# Patient Record
Sex: Female | Born: 1935 | Race: White | Hispanic: No | Smoking: Current every day smoker
Health system: Southern US, Community
[De-identification: ages and names within clinical notes are randomized; demographics above are authoritative.]

## PROBLEM LIST (undated history)

## (undated) DIAGNOSIS — C801 Malignant (primary) neoplasm, unspecified: Secondary | ICD-10-CM

## (undated) DIAGNOSIS — K589 Irritable bowel syndrome without diarrhea: Secondary | ICD-10-CM

## (undated) DIAGNOSIS — N200 Calculus of kidney: Secondary | ICD-10-CM

## (undated) DIAGNOSIS — K219 Gastro-esophageal reflux disease without esophagitis: Secondary | ICD-10-CM

## (undated) DIAGNOSIS — I10 Essential (primary) hypertension: Secondary | ICD-10-CM

## (undated) DIAGNOSIS — E785 Hyperlipidemia, unspecified: Secondary | ICD-10-CM

## (undated) HISTORY — DX: Irritable bowel syndrome, unspecified: K58.9

## (undated) HISTORY — PX: BREAST SURGERY: SHX581

## (undated) HISTORY — DX: Gastro-esophageal reflux disease without esophagitis: K21.9

## (undated) HISTORY — PX: OTHER SURGICAL HISTORY: SHX169

## (undated) HISTORY — DX: Essential (primary) hypertension: I10

## (undated) HISTORY — DX: Malignant (primary) neoplasm, unspecified: C80.1

## (undated) HISTORY — DX: Hyperlipidemia, unspecified: E78.5

## (undated) HISTORY — DX: Calculus of kidney: N20.0

---

## 2005-10-27 ENCOUNTER — Ambulatory Visit: Payer: Self-pay | Admitting: Internal Medicine

## 2005-10-29 ENCOUNTER — Ambulatory Visit: Payer: Self-pay | Admitting: Gastroenterology

## 2005-11-09 ENCOUNTER — Ambulatory Visit: Payer: Self-pay | Admitting: Internal Medicine

## 2006-12-22 ENCOUNTER — Ambulatory Visit: Payer: Self-pay | Admitting: Internal Medicine

## 2007-05-11 ENCOUNTER — Ambulatory Visit: Payer: Self-pay | Admitting: Internal Medicine

## 2008-02-14 ENCOUNTER — Ambulatory Visit: Payer: Self-pay | Admitting: Internal Medicine

## 2009-11-12 ENCOUNTER — Ambulatory Visit: Payer: Self-pay | Admitting: Internal Medicine

## 2011-03-11 ENCOUNTER — Ambulatory Visit: Payer: Self-pay | Admitting: Internal Medicine

## 2011-04-09 ENCOUNTER — Ambulatory Visit: Payer: Self-pay | Admitting: Emergency Medicine

## 2011-04-09 LAB — HEMOGLOBIN: HGB: 12.7 g/dL (ref 12.0–16.0)

## 2011-04-16 ENCOUNTER — Ambulatory Visit: Payer: Self-pay | Admitting: Emergency Medicine

## 2013-11-28 ENCOUNTER — Ambulatory Visit: Payer: Self-pay | Admitting: Obstetrics and Gynecology

## 2014-01-25 ENCOUNTER — Ambulatory Visit (INDEPENDENT_AMBULATORY_CARE_PROVIDER_SITE_OTHER): Payer: Medicare Other | Admitting: General Surgery

## 2014-01-25 ENCOUNTER — Encounter: Payer: Self-pay | Admitting: General Surgery

## 2014-01-25 VITALS — BP 136/80 | HR 66 | Resp 14 | Ht <= 58 in | Wt 119.0 lb

## 2014-01-25 DIAGNOSIS — L723 Sebaceous cyst: Secondary | ICD-10-CM

## 2014-01-25 NOTE — Patient Instructions (Addendum)
The patient is aware to call back for any questions or concerns.  

## 2014-01-25 NOTE — Progress Notes (Signed)
Patient ID: Nancy Davies, female   DOB: 16-Apr-1934, 79 y.o.   MRN: [id number]  Chief Complaint  Patient presents with  . Abscess    HPI Nancy Davies is a 79 y.o. female.  here today for evaluation of an abscess on right side of back. The patient states the area has been there for years but within the last week it has gotten larger and painful. There is some drainage noted. She was started on Doxycycline which she took for a couple of days and stopped it because she had diarrhea and dizziness. She denies any fever or chills.   HPI  Past Medical History  Diagnosis Date  . Hypertension   . Hyperlipidemia   . Kidney stones   . 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 GERD (gastroesophageal reflux disease)   . IBS (irritable bowel syndrome)     Past Surgical History  Procedure Laterality Date  . Cesarean section  1975  . Breast surgery  2005,2011  . Excision of sebacceous cyst      x 2    History reviewed. No pertinent family history.  Social History History  Substance Use Topics  . Smoking status: Current Every Day Smoker -- 1.00 packs/day for 50 years  . Smokeless tobacco: Not on file  . Alcohol Use: Yes    Allergies  Allergen Reactions  . Sulfa Antibiotics Other (See Comments)    Dry heaves    Current Outpatient Prescriptions  Medication Sig Dispense Refill  . ADVAIR DISKUS 250-50 MCG/DOSE AEPB       . B Complex Vitamins (VITAMIN B COMPLEX PO) Take by mouth.      . BYSTOLIC 10 MG tablet       . montelukast (SINGULAIR) 10 MG tablet       . nitrofurantoin, macrocrystal-monohydrate, (MACROBID) 100 MG capsule       . pantoprazole (PROTONIX) 40 MG tablet       . simvastatin (ZOCOR) 10 MG tablet       . SPIRIVA HANDIHALER 18 MCG inhalation capsule Place 18 mcg into inhaler and inhale daily.       . TEKTURNA 300 MG tablet       . VITAMIN D, ERGOCALCIFEROL, PO Take by mouth.       No current facility-administered medications for this visit.    Review of  Systems Review of Systems  Constitutional: Negative.   Respiratory: Negative.   Cardiovascular: Negative.     Blood pressure 136/80, pulse 66, resp. rate 14, height 4\' 10"  (1.473 m), weight 119 lb (53.978 kg).  Physical Exam Physical Exam  Constitutional: She is oriented to person, place, and time. She appears well-developed and well-nourished.  Neurological: She is alert and oriented to person, place, and time.  Skin: Skin is warm and dry.  2 cm sebaceous cyst infrascapular region with developing sebaceous (horn) concretions. No redness or induration surrounding skin.     Data Reviewed    Assessment    Sebseous cyst     Plan    Discussed excision. Pt wishes to wait till next yr. Aware to call if she has any more drainage or increased pain with the cyst        Newt Levingston G 01/25/2014, 6:36 PM

## 2014-01-30 ENCOUNTER — Encounter: Payer: Self-pay | Admitting: General Surgery

## 2014-04-24 ENCOUNTER — Ambulatory Visit: Payer: Self-pay | Admitting: Family Medicine

## 2014-07-22 NOTE — Op Note (Signed)
PATIENT NAME:  Nancy Davies, Nancy Davies MR#:  [id number] DATE OF BIRTH:  [birth year]  DATE OF PROCEDURE:  04/16/2011  PREOPERATIVE DIAGNOSIS: Right breast tumor with microcalcification.   POSTOPERATIVE DIAGNOSIS: Right breast tumor with microcalcification.   OPERATION: Right breast needle localization biopsy.   SURGEON: Lynnann Knudsen S. Jakie Debow, MD   INDICATION FOR SURGERY: This patient was seen by me with Davies mammogram showing microcalcification in the right breast. The patient had no pain. The patient had similar surgery done in the past and now she is having mammogram every year. She has Davies strong family history of breast cancer. I did not feel any mass in the breast but the microcalcification looked very suspicious. The patient was then brought to surgery.   PROCEDURE: Under MAC anesthesia, the right breast was then prepped and draped. It was then infiltrated with 1% Xylocaine and Marcaine mixture. After making incision, cutting skin and subcutaneous tissue, the breast tissue was then incised. The breast wire was quite deep. I went away from the wire because Davies microcalcification was in the middle of the thick part of the needle. I went all the way down to the wire and removed the end of the wire. After the bleeding was stopped with the Bovie, the wound was then closed in layers with 3-0 Vicryl and 4-0 Vicryl. Subcuticular suturing was then done. I got the x-ray report and was told microcalcifications were present in the specimen. The specimen was then sent to the laboratory for examination.   The patient tolerated the procedure well and was sent to the recovery room in satisfactory condition.    ____________________________ Welford Roche Phylis Bougie, MD [id number] D: 04/16/2011 11:27:04 ET T: 04/16/2011 12:20:06 ET JOB#: [id number]  cc: Sean Malinowski S. Phylis Bougie, MD, <Dictator>, Perrin Maltese, MD Sharene Butters MD ELECTRONICALLY SIGNED 04/21/2011 13:02

## 2015-03-30 IMAGING — CT CT ABDOMEN AND PELVIS WITHOUT AND WITH CONTRAST
2 of 10 series · 10 of 46 positions shown, 16 images · IV contrast (isovue)
Comparison: None.

CLINICAL DATA: Urinary tract infections

EXAM:
CT ABDOMEN AND PELVIS WITHOUT AND WITH CONTRAST
TECHNIQUE: Multidetector CT imaging of the abdomen and pelvis was performed
following the standard protocol before and following the bolus
administration of intravenous contrast.
CONTRAST:  100 cc of Isovue 370.

[Series 2: hematuria > 45 wo · axial · 0.72mm/px · z∈[-520,-205]mm · 8 of 81 slices shown, 13 images]
[im 9/81  soft-tissue]
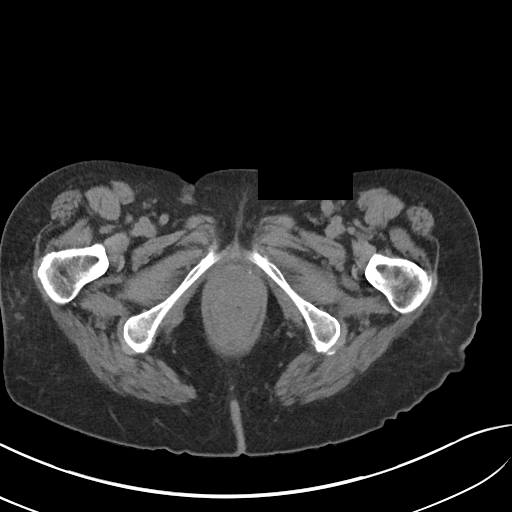
[im 9/81  bone]
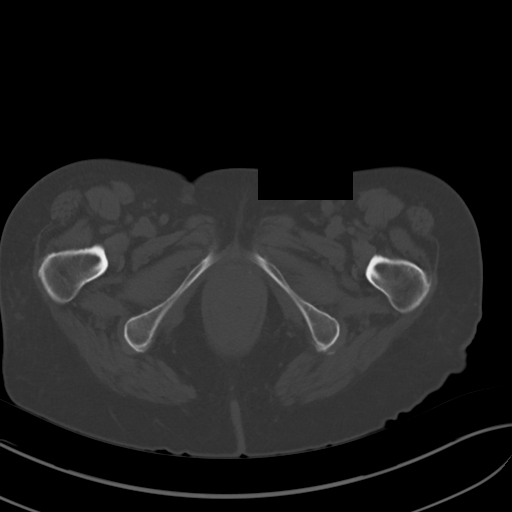
[im 18/81  soft-tissue]
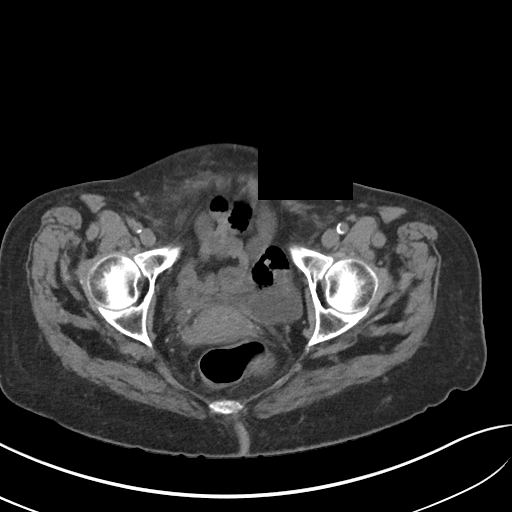
[im 27/81  soft-tissue]
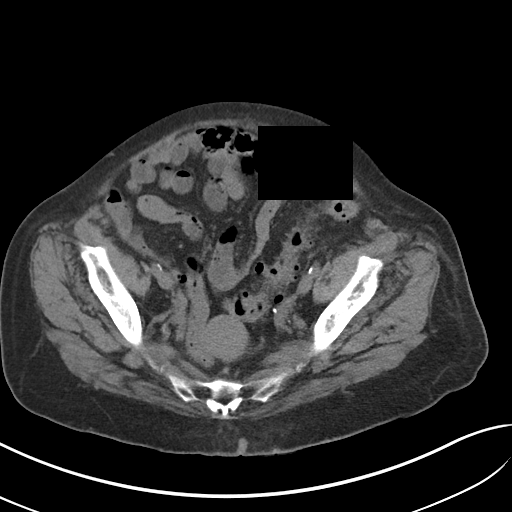
[im 36/81  soft-tissue]
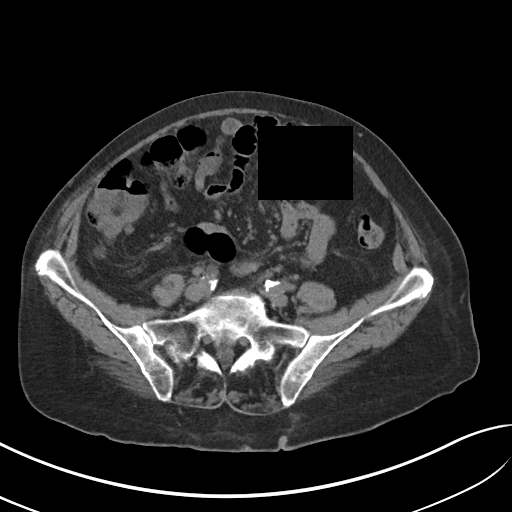
[im 45/81  soft-tissue]
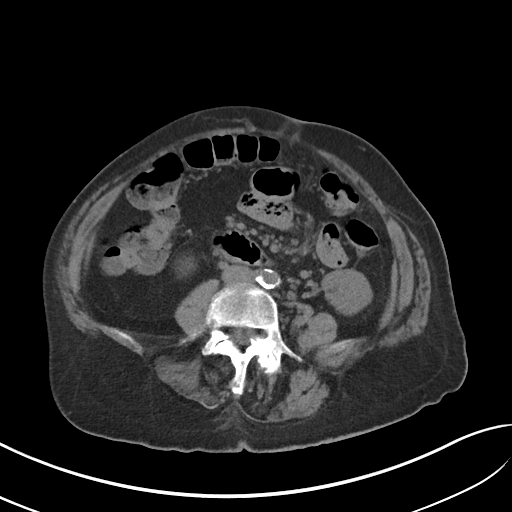
[im 45/81  lung]
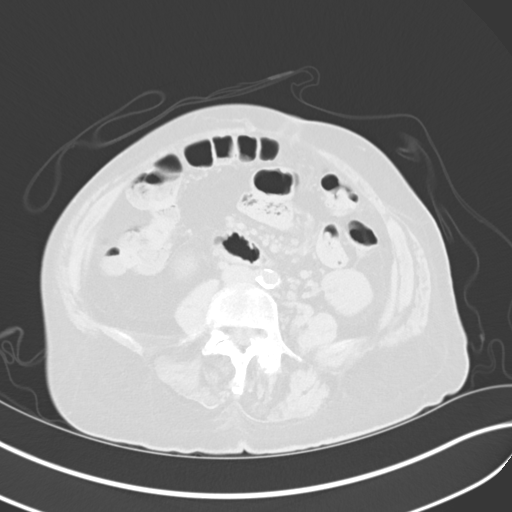
[im 54/81  soft-tissue]
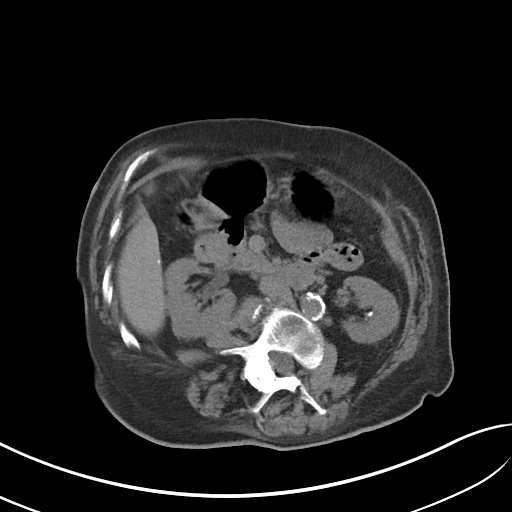
[im 54/81  lung]
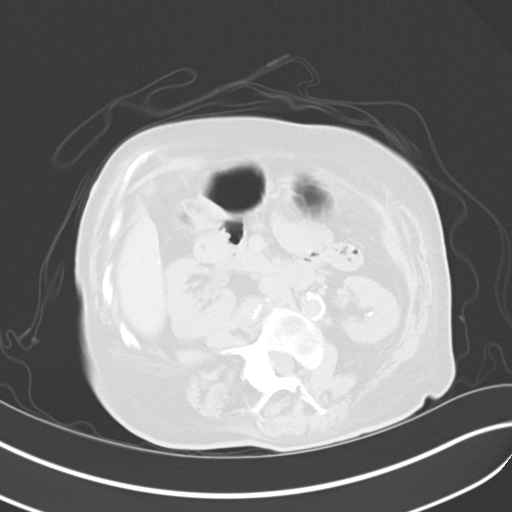
[im 63/81  soft-tissue]
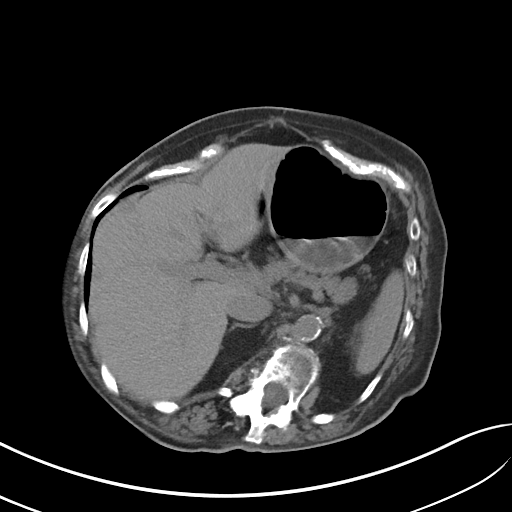
[im 63/81  lung]
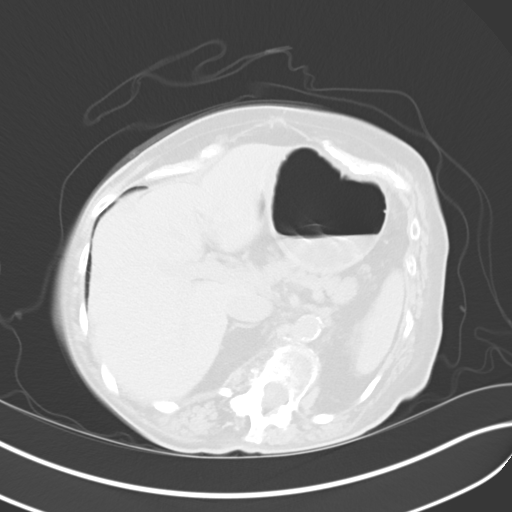
[im 72/81  soft-tissue]
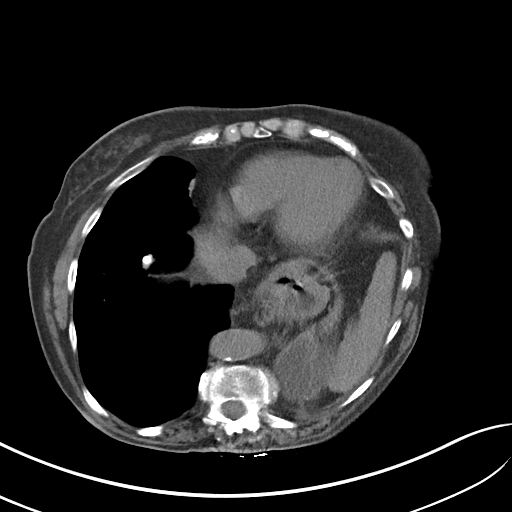
[im 72/81  lung]
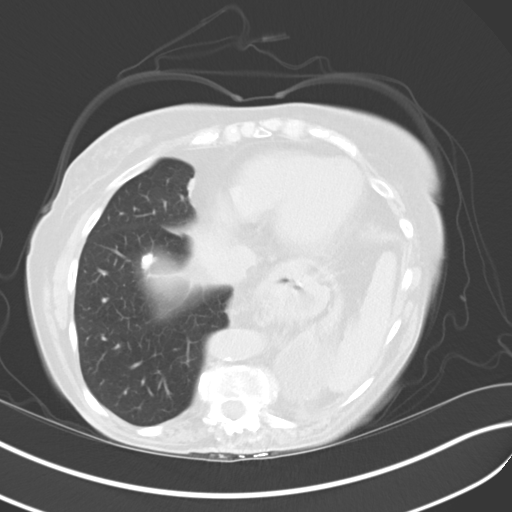

[Series 7: cor hematuria > 45 wo · coronal · 0.69mm/px · 2 of 131 slices shown, 3 images]
[im 44/131  soft-tissue]
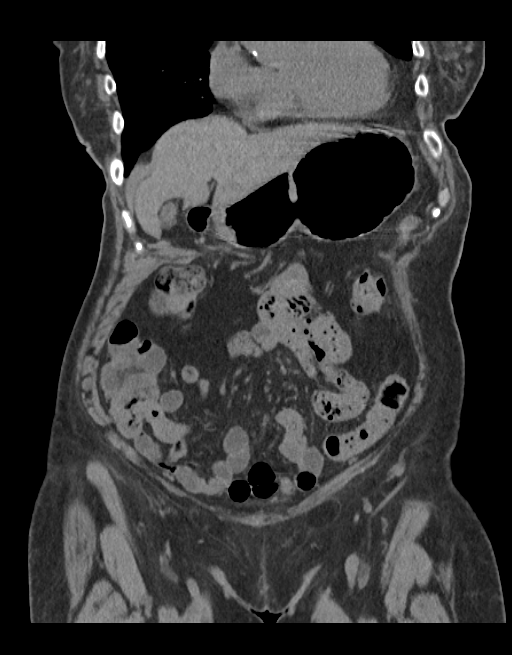
[im 44/131  bone]
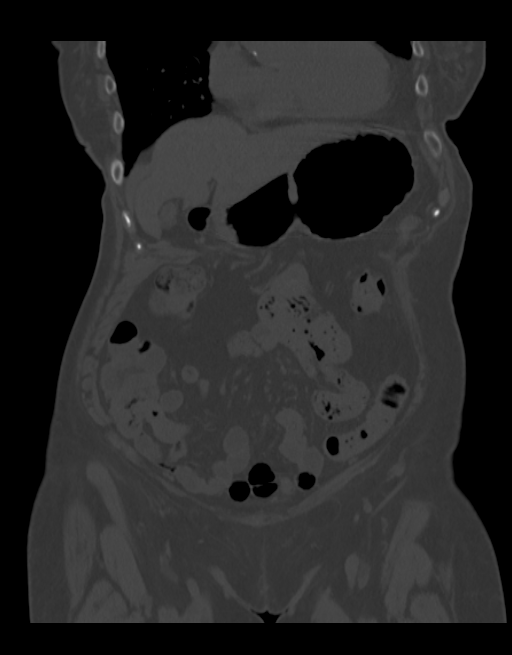
[im 87/131  soft-tissue]
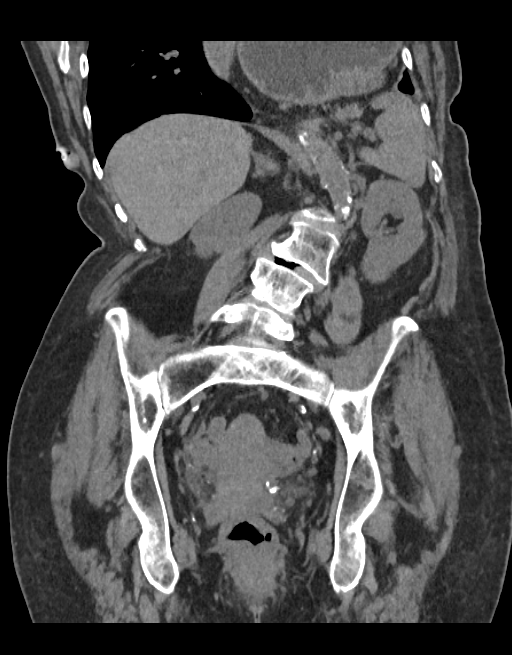

[10 of 46 positions shown; findings below may reference images not displayed]

FINDINGS: There is no pleural effusion identified. Within the posterior left
hemithorax there is a peripherally calcified cavitary mass measuring
5.9 x 2.5 x 3.5 cm. There is a calcified granuloma in the right
lower lobe. Calcified right hilar lymph nodes are identified. Large
hiatal hernia is noted containing greater than 60% intra thoracic
stomach.

No focal liver abnormality. The gallbladder is normal. No
significant biliary dilatation. Normal appearance of the pancreas.
Calcified granulomas identified within the spleen. The adrenal
glands are both normal. Stone within the inferior pole of the left
kidney measures 3 mm, image 29/series 4. Right renal calculus
measures 4 mm, image 33/series 4. No hydronephrosis or hydroureter.
The urinary bladder appears normal for degree of distention. Fibroid
uterus identified. There is a subserosal fibroid arising from the
posterior myometrium measuring 3.4 x 3.1 x 3.4 cm, image 62/series
11.

Calcified atherosclerotic disease involves the abdominal aorta. No
aneurysm. No retroperitoneal adenopathy identified. There is no
pelvic or inguinal adenopathy identified. No free fluid or fluid
collections identified. The small bowel loops have a normal course
and caliber. There is no evidence for bowel obstruction. Normal
appearance of the proximal colon. There is multiple distal colonic
diverticula identified.

Review of the visualized osseous structures is remarkable for a
marked thoracic and lumbar scoliosis deformity with multi level
degenerative disc disease.
IMPRESSION: 1. No acute findings within the abdomen or pelvis.
2. Bilateral nonobstructing renal calculi.
3. Atherosclerotic disease.
4. Fibroid uterus
5. Large hiatal hernia.
6. Prior granulomatous disease
7. Cavitary mass within the posterior left hemi thorax which is
peripherally calcified is of uncertain etiology. Given other
findings in the chest and abdomen of this is favored to represent a
chronic finding, likely the sequelae of prior granulomatous disease.
8. Scoliosis and multilevel lumbar degenerative disc disease.

## 2015-08-24 IMAGING — US US EXTREM LOW VENOUS*L*
1 series · 14 of 24 positions shown · non-contrast
Comparison: None.

CLINICAL DATA: Left calf pain. Increased swelling since fall on
03/28/2014.

EXAM:
LEFT LOWER EXTREMITY VENOUS DOPPLER ULTRASOUND
TECHNIQUE: Gray-scale sonography with graded compression, as well as color
Doppler and duplex ultrasound, were performed to evaluate the deep
venous system from the level of the common femoral vein through the
popliteal and proximal calf veins. Spectral Doppler was utilized to
evaluate flow at rest and with distal augmentation maneuvers.

[Series 1: us extrem low venous*left* · 0.07mm/px · 14 of 30 slices shown]
[im 1/30]
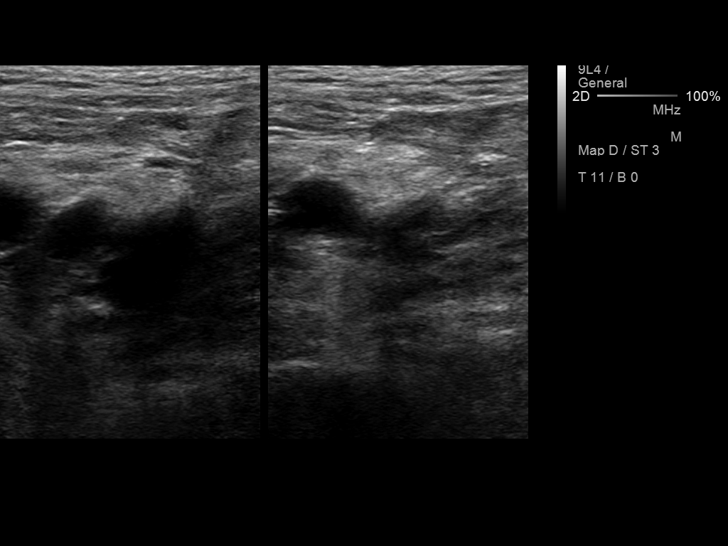
[im 3/30]
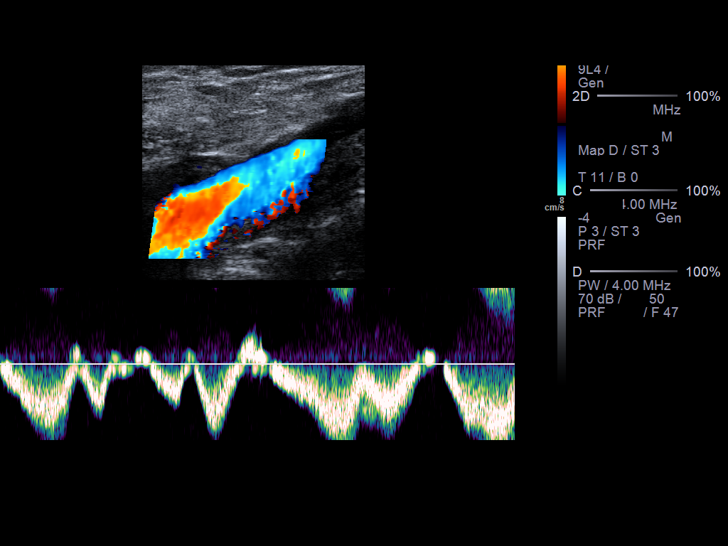
[im 6/30]
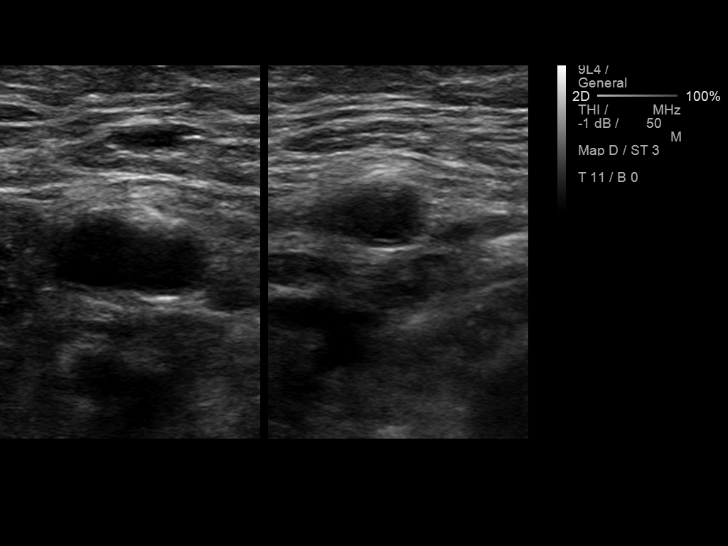
[im 8/30]
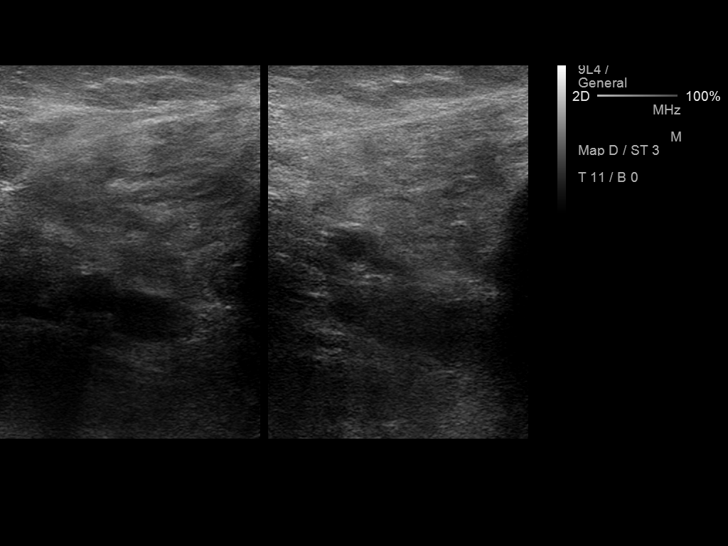
[im 9/30]
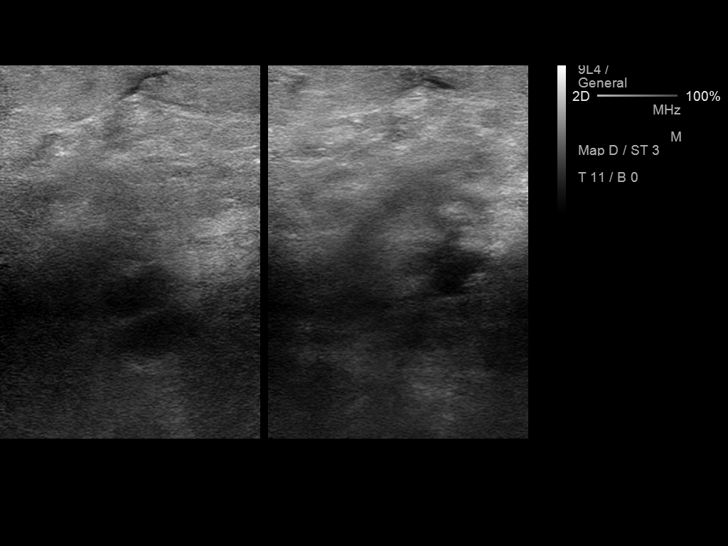
[im 12/30]
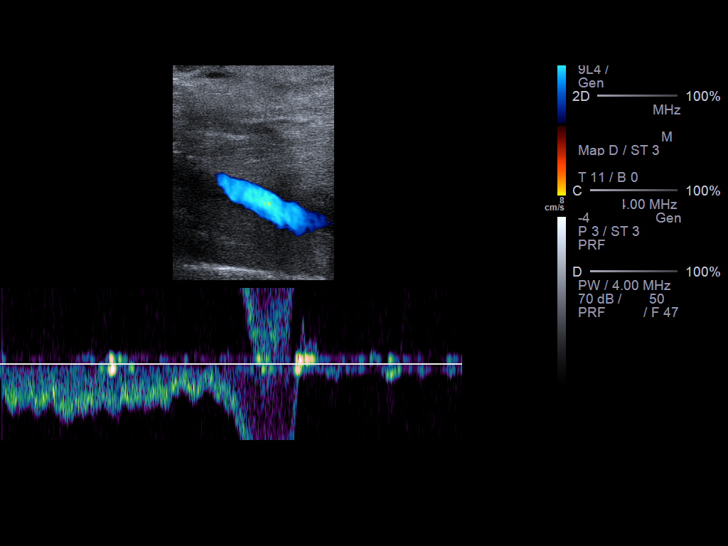
[im 14/30]
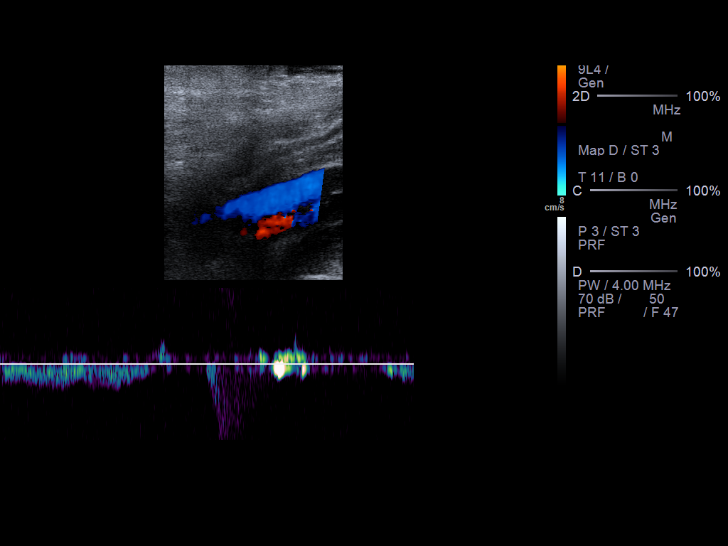
[im 16/30]
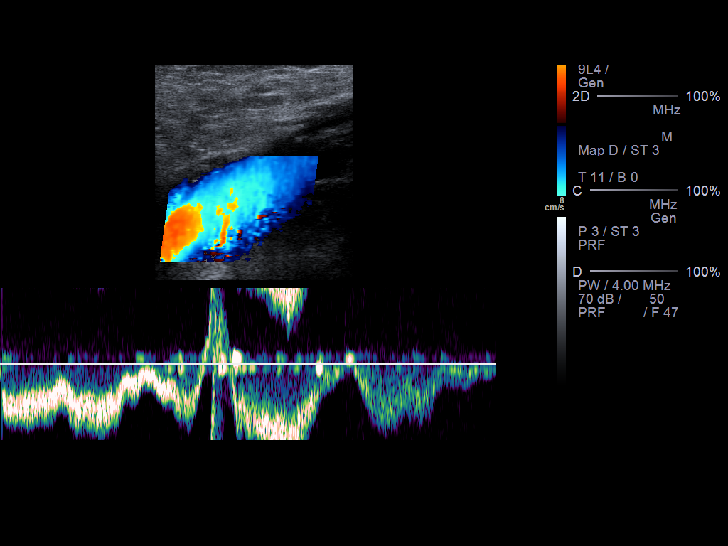
[im 18/30]
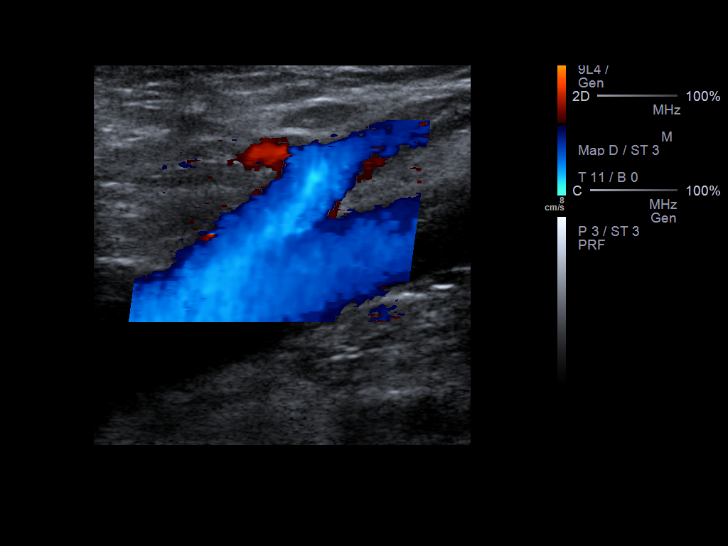
[im 21/30]
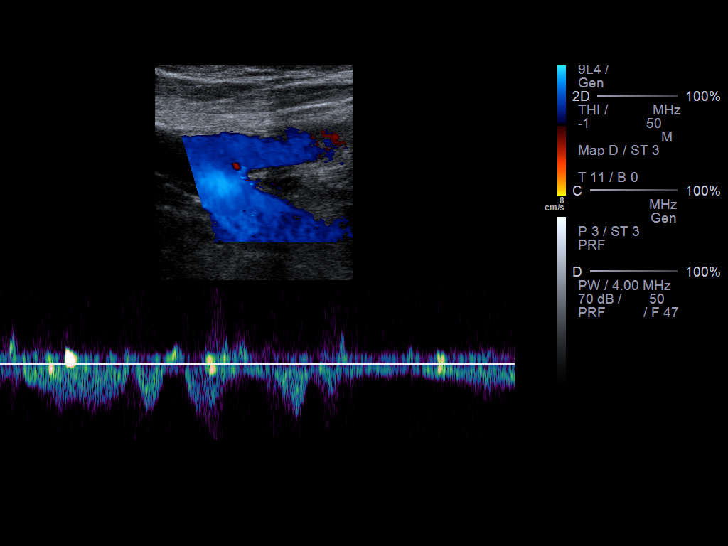
[im 23/30]
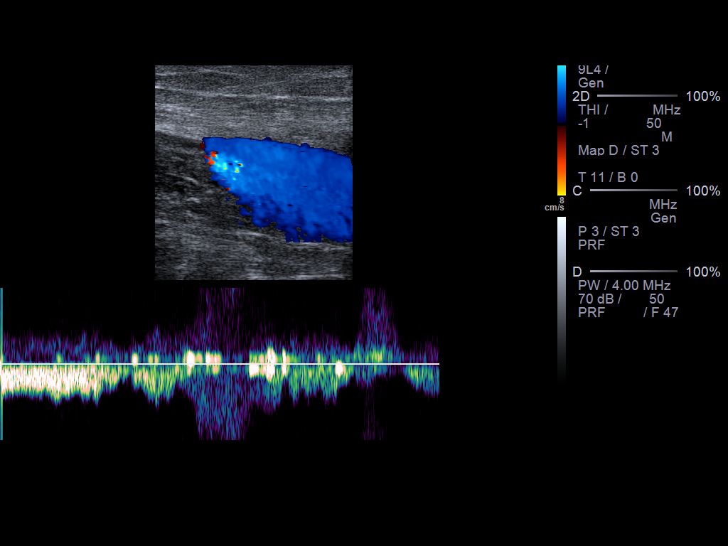
[im 24/30]
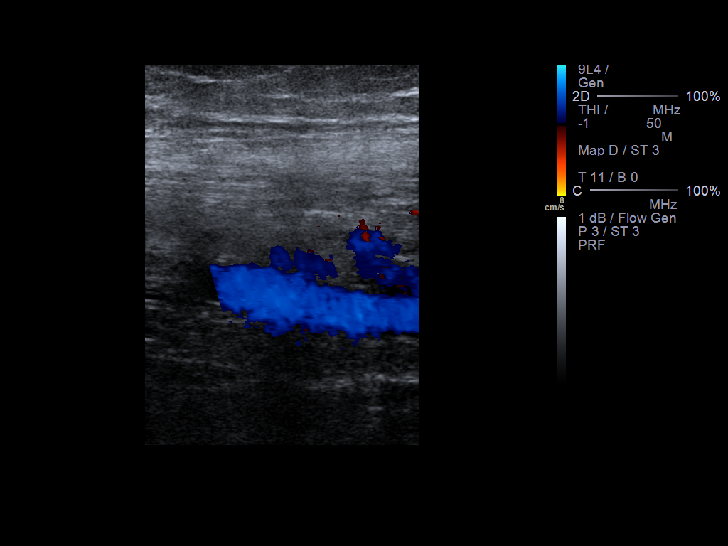
[im 27/30]
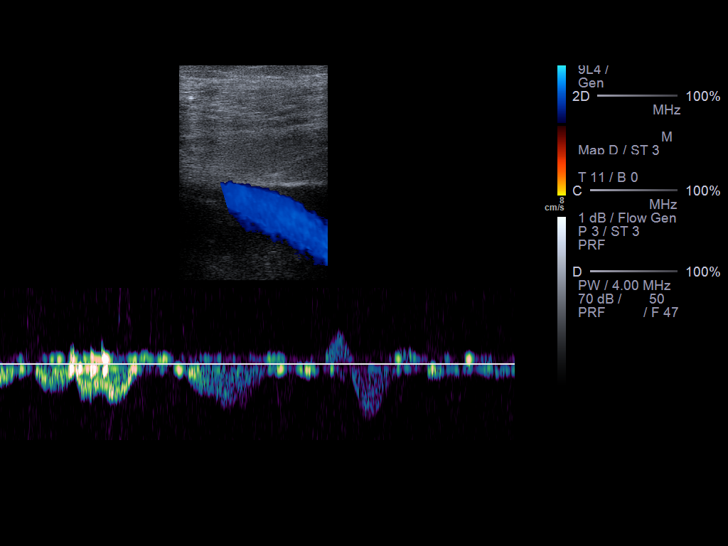
[im 30/30]
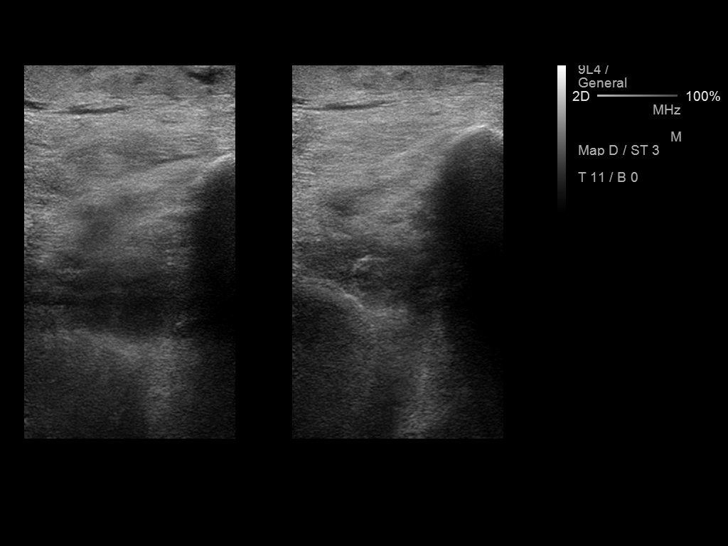

[14 of 24 positions shown; findings below may reference images not displayed]

FINDINGS: Normal compressibility, augmentation and color Doppler flow in the
left common femoral vein, left femoral vein and left popliteal vein.
The left saphenofemoral junction is patent. Visualized left deep
calf veins are patent. There is subcutaneous edema in the left knee
and calf region.

The right common femoral vein is patent.
IMPRESSION: Negative for left lower extremity deep vein thrombosis.
